# Patient Record
Sex: Female | Born: 1995 | Race: White | Hispanic: No | Marital: Single | Smoking: Never smoker
Health system: Southern US, Community
[De-identification: ages and names within clinical notes are randomized; demographics above are authoritative.]

## PROBLEM LIST (undated history)

## (undated) DIAGNOSIS — E669 Obesity, unspecified: Secondary | ICD-10-CM

## (undated) DIAGNOSIS — E282 Polycystic ovarian syndrome: Secondary | ICD-10-CM

## (undated) DIAGNOSIS — L732 Hidradenitis suppurativa: Secondary | ICD-10-CM

## (undated) DIAGNOSIS — Z7689 Persons encountering health services in other specified circumstances: Principal | ICD-10-CM

## (undated) DIAGNOSIS — N898 Other specified noninflammatory disorders of vagina: Secondary | ICD-10-CM

## (undated) HISTORY — DX: Hidradenitis suppurativa: L73.2

## (undated) HISTORY — DX: Polycystic ovarian syndrome: E28.2

## (undated) HISTORY — DX: Obesity, unspecified: E66.9

## (undated) HISTORY — DX: Persons encountering health services in other specified circumstances: Z76.89

## (undated) HISTORY — DX: Other specified noninflammatory disorders of vagina: N89.8

---
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
## 2014-03-12 ENCOUNTER — Telehealth: Payer: Self-pay | Admitting: Adult Health

## 2014-03-12 NOTE — Telephone Encounter (Signed)
Left message I called 

## 2014-03-13 NOTE — Telephone Encounter (Signed)
Mom called Shawna Wright is not taking OCs, last period May and moody, wants her back on the pill to make appt

## 2014-03-20 ENCOUNTER — Ambulatory Visit (INDEPENDENT_AMBULATORY_CARE_PROVIDER_SITE_OTHER): Payer: BC Managed Care – PPO | Admitting: Adult Health

## 2014-03-20 ENCOUNTER — Encounter: Payer: Self-pay | Admitting: Adult Health

## 2014-03-20 VITALS — BP 110/78 | HR 78 | Ht 64.0 in | Wt 220.5 lb

## 2014-03-20 DIAGNOSIS — N926 Irregular menstruation, unspecified: Secondary | ICD-10-CM

## 2014-03-20 DIAGNOSIS — Z7689 Persons encountering health services in other specified circumstances: Secondary | ICD-10-CM

## 2014-03-20 DIAGNOSIS — E669 Obesity, unspecified: Secondary | ICD-10-CM

## 2014-03-20 MED ORDER — NORGESTIM-ETH ESTRAD TRIPHASIC 0.18/0.215/0.25 MG-25 MCG PO TABS: 1.0000 | ORAL_TABLET | Freq: Every day | ORAL | Status: DC

## 2014-03-20 NOTE — Patient Instructions (Signed)
Oral Contraception Use Oral contraceptive pills (OCPs) are medicines taken to prevent pregnancy. OCPs work by preventing the ovaries from releasing eggs. The hormones in OCPs also cause the cervical mucus to thicken, preventing the sperm from entering the uterus. The hormones also cause the uterine lining to become thin, not allowing a fertilized egg to attach to the inside of the uterus. OCPs are highly effective when taken exactly as prescribed. However, OCPs do not prevent sexually transmitted diseases (STDs). Safe sex practices, such as using condoms along with an OCP, can help prevent STDs. Before taking OCPs, you may have a physical exam and Pap test. Your health care provider may also order blood tests if necessary. Your health care provider will make sure you are a good candidate for oral contraception. Discuss with your health care provider the possible side effects of the OCP you may be prescribed. When starting an OCP, it can take 2 to 3 months for the body to adjust to the changes in hormone levels in your body.  HOW TO TAKE ORAL CONTRACEPTIVE PILLS Your health care provider may advise you on how to start taking the first cycle of OCPs. Otherwise, you can:   Start on day 1 of your menstrual period. You will not need any backup contraceptive protection with this start time.   Start on the first Sunday after your menstrual period or the day you get your prescription. In these cases, you will need to use backup contraceptive protection for the first week.   Start the pill at any time of your cycle. If you take the pill within 5 days of the start of your period, you are protected against pregnancy right away. In this case, you will not need a backup form of birth control. If you start at any other time of your menstrual cycle, you will need to use another form of birth control for 7 days. If your OCP is the type called a minipill, it will protect you from pregnancy after taking it for 2 days (48  hours). After you have started taking OCPs:   If you forget to take 1 pill, take it as soon as you remember. Take the next pill at the regular time.   If you miss 2 or more pills, call your health care provider because different pills have different instructions for missed doses. Use backup birth control until your next menstrual period starts.   If you use a 28-day pack that contains inactive pills and you miss 1 of the last 7 pills (pills with no hormones), it will not matter. Throw away the rest of the non-hormone pills and start a new pill pack.  No matter which day you start the OCP, you will always start a new pack on that same day of the week. Have an extra pack of OCPs and a backup contraceptive method available in case you miss some pills or lose your OCP pack.  HOME CARE INSTRUCTIONS   Do not smoke.   Always use a condom to protect against STDs. OCPs do not protect against STDs.   Use a calendar to mark your menstrual period days.   Read the information and directions that came with your OCP. Talk to your health care provider if you have questions.  SEEK MEDICAL CARE IF:   You develop nausea and vomiting.   You have abnormal vaginal discharge or bleeding.   You develop a rash.   You miss your menstrual period.   You are losing   your hair.   You need treatment for mood swings or depression.   You get dizzy when taking the OCP.   You develop acne from taking the OCP.   You become pregnant.  SEEK IMMEDIATE MEDICAL CARE IF:   You develop chest pain.   You develop shortness of breath.   You have an uncontrolled or severe headache.   You develop numbness or slurred speech.   You develop visual problems.   You develop pain, redness, and swelling in the legs.  Document Released: 06/18/2011 Document Revised: 11/13/2013 Document Reviewed: 12/18/2012 [facility] Patient Information 2015 [place], [place]. This information is not intended to replace  advice given to you by your health care provider. Make sure you discuss any questions you have with your health care provider. Start Sunday  Follow up in 3 months Try Whole 30 and increase activity  Calorie Counting for Weight Loss Calories are energy you get from the things you eat and drink. Your body uses this energy to keep you going throughout the day. The number of calories you eat affects your weight. When you eat more calories than your body needs, your body stores the extra calories as fat. When you eat fewer calories than your body needs, your body burns fat to get the energy it needs. Calorie counting means keeping track of how many calories you eat and drink each day. If you make sure to eat fewer calories than your body needs, you should lose weight. In order for calorie counting to work, you will need to eat the number of calories that are right for you in a day to lose a healthy amount of weight per week. A healthy amount of weight to lose per week is usually 1-2 lb (0.5-0.9 kg). A dietitian can determine how many calories you need in a day and give you suggestions on how to reach your calorie goal.  WHAT IS MY MY PLAN? My goal is to have __________ calories per day.  If I have this many calories per day, I should lose around __________ pounds per week. WHAT DO I NEED TO KNOW ABOUT CALORIE COUNTING? In order to meet your daily calorie goal, you will need to:  Find out how many calories are in each food you would like to eat. Try to do this before you eat.  Decide how much of the food you can eat.  Write down what you ate and how many calories it had. Doing this is called keeping a food log. WHERE DO I FIND CALORIE INFORMATION? The number of calories in a food can be found on a Nutrition Facts label. Note that all the information on a label is based on a specific serving of the food. If a food does not have a Nutrition Facts label, try to look up the calories online or ask your  dietitian for help. HOW DO I DECIDE HOW MUCH TO EAT? To decide how much of the food you can eat, you will need to consider both the number of calories in one serving and the size of one serving. This information can be found on the Nutrition Facts label. If a food does not have a Nutrition Facts label, look up the information online or ask your dietitian for help. Remember that calories are listed per serving. If you choose to have more than one serving of a food, you will have to multiply the calories per serving by the amount of servings you plan to eat. For example, the  label on a package of bread might say that a serving size is 1 slice and that there are 90 calories in a serving. If you eat 1 slice, you will have eaten 90 calories. If you eat 2 slices, you will have eaten 180 calories. HOW DO I KEEP A FOOD LOG? After each meal, record the following information in your food log:  What you ate.  How much of it you ate.  How many calories it had.  Then, add up your calories. Keep your food log near you, such as in a small notebook in your pocket. Another option is to use a mobile app or website. Some programs will calculate calories for you and show you how many calories you have left each time you add an item to the log. WHAT ARE SOME CALORIE COUNTING TIPS?  Use your calories on foods and drinks that will fill you up and not leave you hungry. Some examples of this include foods like nuts and nut butters, vegetables, lean proteins, and high-fiber foods (more than 5 g fiber per serving).  Eat nutritious foods and avoid empty calories. Empty calories are calories you get from foods or beverages that do not have many nutrients, such as candy and soda. It is better to have a nutritious high-calorie food (such as an avocado) than a food with few nutrients (such as a bag of chips).  Know how many calories are in the foods you eat most often. This way, you do not have to look up how many calories  they have each time you eat them.  Look out for foods that may seem like low-calorie foods but are really high-calorie foods, such as baked goods, soda, and fat-free candy.  Pay attention to calories in drinks. Drinks such as sodas, specialty coffee drinks, alcohol, and juices have a lot of calories yet do not fill you up. Choose low-calorie drinks like water and diet drinks.  Focus your calorie counting efforts on higher calorie items. Logging the calories in a garden salad that contains only vegetables is less important than calculating the calories in a milk shake.  Find a way of tracking calories that works for you. Get creative. Most people who are successful find ways to keep track of how much they eat in a day, even if they do not count every calorie. WHAT ARE SOME PORTION CONTROL TIPS?  Know how many calories are in a serving. This will help you know how many servings of a certain food you can have.  Use a measuring cup to measure serving sizes. This is helpful when you start out. With time, you will be able to estimate serving sizes for some foods.  Take some time to put servings of different foods on your favorite plates, bowls, and cups so you know what a serving looks like.  Try not to eat straight from a bag or box. Doing this can lead to overeating. Put the amount you would like to eat in a cup or on a plate to make sure you are eating the right portion.  Use smaller plates, glasses, and bowls to prevent overeating. This is a quick and easy way to practice portion control. If your plate is smaller, less food can fit on it.  Try not to multitask while eating, such as watching TV or using your computer. If it is time to eat, sit down at a table and enjoy your food. Doing this will help you to start recognizing when you  are full. It will also make you more aware of what and how much you are eating. HOW CAN I CALORIE COUNT WHEN EATING OUT?  Ask for smaller portion sizes or  child-sized portions.  Consider sharing an entree and sides instead of getting your own entree.  If you get your own entree, eat only half. Ask for a box at the beginning of your meal and put the rest of your entree in it so you are not tempted to eat it.  Look for the calories on the menu. If calories are listed, choose the lower calorie options.  Choose dishes that include vegetables, fruits, whole grains, low-fat dairy products, and lean protein. Focusing on smart food choices from each of the 5 food groups can help you stay on track at restaurants.  Choose items that are boiled, broiled, grilled, or steamed.  Choose water, milk, unsweetened iced tea, or other drinks without added sugars. If you want an alcoholic beverage, choose a lower calorie option. For example, a regular margarita can have up to 700 calories and a glass of wine has around 150.  Stay away from items that are buttered, battered, fried, or served with cream sauce. Items labeled "crispy" are usually fried, unless stated otherwise.  Ask for dressings, sauces, and syrups on the side. These are usually very high in calories, so do not eat much of them.  Watch out for salads. Many people think salads are a healthy option, but this is often not the case. Many salads come with bacon, fried chicken, lots of cheese, fried chips, and dressing. All of these items have a lot of calories. If you want a salad, choose a garden salad and ask for grilled meats or steak. Ask for the dressing on the side, or ask for olive oil and vinegar or lemon to use as dressing.  Estimate how many servings of a food you are given. For example, a serving of cooked rice is  cup or about the size of half a tennis ball or one cupcake wrapper. Knowing serving sizes will help you be aware of how much food you are eating at restaurants. The list below tells you how big or small some common portion sizes are based on everyday objects.  1 oz--4 stacked  dice.  3 oz--1 deck of cards.  1 tsp--1 dice.  1 Tbsp-- a Ping-Pong ball.  2 Tbsp--1 Ping-Pong ball.   cup--1 tennis ball or 1 cupcake wrapper.  1 cup--1 baseball. Document Released: 06/29/2005 Document Revised: 11/13/2013 Document Reviewed: 05/04/2013 [facility] Patient Information 2015 [place], [place]. This information is not intended to replace advice given to you by your health care provider. Make sure you discuss any questions you have with your health care provider.

## 2014-03-20 NOTE — Progress Notes (Signed)
Subjective:     Patient ID: Shawna Wright, female   DOB: 03-16-1996, 18 y.o.   MRN: [id number]  HPI Shawna Wright is a 18 year old white female with irregular periods, in to get back on OCs, has never had sex.She is trying to lose weight, trying weight watchers.  Review of Systems See HPI Reviewed past medical,surgical, social and family history. Reviewed medications and allergies.     Objective:   Physical Exam BP 110/78  Pulse 78  Ht  (1.626 m)  Wt 220 lb 8 oz (100.018 kg)  BMI 37.83 kg/m2  LMP 05/15/2015UPT negative, Skin warm and dry. Neck: mid line trachea, normal thyroid. Lungs: clear to ausculation bilaterally. Cardiovascular: regular rate and rhythm.    Assessment:     Period management Irregular periods Obesity     Plan:     Rx ortho tri cyclen lo disp 1 pack take 1 daily with 11 refills,start Sunday Try whole 30, increase exercise Review handouts on OC use and weight loss Follow up in 3 months

## 2014-05-14 ENCOUNTER — Emergency Department (HOSPITAL_COMMUNITY)
Admission: EM | Admit: 2014-05-14 | Discharge: 2014-05-14 | Disposition: A | Discharge status: Home / Self Care | Payer: BC Managed Care – PPO | Attending: Emergency Medicine | Admitting: Emergency Medicine

## 2014-05-14 ENCOUNTER — Encounter (HOSPITAL_COMMUNITY): Payer: Self-pay | Admitting: Emergency Medicine

## 2014-05-14 DIAGNOSIS — R109 Unspecified abdominal pain: Secondary | ICD-10-CM

## 2014-05-14 DIAGNOSIS — E669 Obesity, unspecified: Secondary | ICD-10-CM | POA: Diagnosis not present

## 2014-05-14 DIAGNOSIS — R1031 Right lower quadrant pain: Secondary | ICD-10-CM | POA: Insufficient documentation

## 2014-05-14 DIAGNOSIS — Z880 Allergy status to penicillin: Secondary | ICD-10-CM | POA: Insufficient documentation

## 2014-05-14 DIAGNOSIS — E282 Polycystic ovarian syndrome: Secondary | ICD-10-CM | POA: Diagnosis not present

## 2014-05-14 DIAGNOSIS — Z872 Personal history of diseases of the skin and subcutaneous tissue: Secondary | ICD-10-CM | POA: Diagnosis not present

## 2014-05-14 DIAGNOSIS — Z3202 Encounter for pregnancy test, result negative: Secondary | ICD-10-CM | POA: Diagnosis not present

## 2014-05-14 DIAGNOSIS — Z79899 Other long term (current) drug therapy: Secondary | ICD-10-CM | POA: Insufficient documentation

## 2014-05-14 LAB — COMPREHENSIVE METABOLIC PANEL
ALT: 11 U/L (ref 0–35)
AST: 12 U/L (ref 0–37)
Albumin: 3.5 g/dL (ref 3.5–5.2)
Alkaline Phosphatase: 55 U/L (ref 39–117)
Anion gap: 16 — ABNORMAL HIGH (ref 5–15)
BUN: 12 mg/dL (ref 6–23)
CALCIUM: 9.3 mg/dL (ref 8.4–10.5)
CO2: 19 meq/L (ref 19–32)
Chloride: 103 mEq/L (ref 96–112)
Creatinine, Ser: 0.68 mg/dL (ref 0.50–1.10)
GLUCOSE: 104 mg/dL — AB (ref 70–99)
Potassium: 3.7 mEq/L (ref 3.7–5.3)
SODIUM: 138 meq/L (ref 137–147)
Total Protein: 8.3 g/dL (ref 6.0–8.3)

## 2014-05-14 LAB — CBC WITH DIFFERENTIAL/PLATELET
Basophils Absolute: 0 10*3/uL (ref 0.0–0.1)
Basophils Relative: 0 % (ref 0–1)
EOS PCT: 1 % (ref 0–5)
Eosinophils Absolute: 0.1 10*3/uL (ref 0.0–0.7)
HEMATOCRIT: 37.5 % (ref 36.0–46.0)
Hemoglobin: 12.2 g/dL (ref 12.0–15.0)
LYMPHS ABS: 2.9 10*3/uL (ref 0.7–4.0)
LYMPHS PCT: 29 % (ref 12–46)
MCH: 26.6 pg (ref 26.0–34.0)
MCHC: 32.5 g/dL (ref 30.0–36.0)
MCV: 81.7 fL (ref 78.0–100.0)
MONO ABS: 0.4 10*3/uL (ref 0.1–1.0)
Monocytes Relative: 4 % (ref 3–12)
Neutro Abs: 6.5 10*3/uL (ref 1.7–7.7)
Neutrophils Relative %: 66 % (ref 43–77)
PLATELETS: 250 10*3/uL (ref 150–400)
RBC: 4.59 MIL/uL (ref 3.87–5.11)
RDW: 14.1 % (ref 11.5–15.5)
WBC: 10 10*3/uL (ref 4.0–10.5)

## 2014-05-14 LAB — URINALYSIS, ROUTINE W REFLEX MICROSCOPIC
BILIRUBIN URINE: NEGATIVE
GLUCOSE, UA: NEGATIVE mg/dL
HGB URINE DIPSTICK: NEGATIVE
KETONES UR: NEGATIVE mg/dL
Leukocytes, UA: NEGATIVE
Nitrite: NEGATIVE
PROTEIN: NEGATIVE mg/dL
Specific Gravity, Urine: >1.03 — ABNORMAL HIGH (ref 1.005–1.030)
Urobilinogen, UA: 0.2 mg/dL (ref 0.0–1.0)
pH: 5 (ref 5.0–8.0)

## 2014-05-14 LAB — PREGNANCY, URINE: Preg Test, Ur: NEGATIVE

## 2014-05-14 LAB — CBG MONITORING, ED: Glucose-Capillary: 106 mg/dL — ABNORMAL HIGH (ref 70–99)

## 2014-05-14 MED ORDER — ONDANSETRON HCL 4 MG/2ML IJ SOLN
4.0000 mg | Freq: Once | INTRAMUSCULAR | Status: AC
  Administered 2014-05-14: 4 mg via INTRAVENOUS

## 2014-05-14 MED ORDER — OXYCODONE-ACETAMINOPHEN 5-325 MG PO TABS: 1.0000 | ORAL_TABLET | ORAL | Status: DC | PRN

## 2014-05-14 MED ORDER — SODIUM CHLORIDE 0.9 % IV BOLUS (SEPSIS)
1000.0000 mL | Freq: Once | INTRAVENOUS | Status: AC
  Administered 2014-05-14: 1000 mL via INTRAVENOUS

## 2014-05-14 MED ORDER — PROMETHAZINE HCL 25 MG PO TABS: 25.0000 mg | ORAL_TABLET | Freq: Four times a day (QID) | ORAL | Status: DC | PRN

## 2014-05-14 MED ORDER — KETOROLAC TROMETHAMINE 30 MG/ML IJ SOLN
30.0000 mg | Freq: Once | INTRAMUSCULAR | Status: AC
  Administered 2014-05-14: 30 mg via INTRAVENOUS
  Filled 2014-05-14: qty 1

## 2014-05-14 MED ORDER — MORPHINE SULFATE 4 MG/ML IJ SOLN
4.0000 mg | Freq: Once | INTRAMUSCULAR | Status: AC
  Administered 2014-05-14: 4 mg via INTRAVENOUS
  Filled 2014-05-14: qty 1

## 2014-05-14 MED ORDER — ONDANSETRON HCL 4 MG/2ML IJ SOLN
4.0000 mg | Freq: Once | INTRAMUSCULAR | Status: DC
  Filled 2014-05-14: qty 2

## 2014-05-14 NOTE — Discharge Instructions (Signed)
Medication for pain and nausea. Return if pain settles in right lower abdomen, poor appetite, fever

## 2014-05-14 NOTE — ED Notes (Signed)
Pt asleep at time of entering room for med admin. nad noted. Easily arousable. 

## 2014-05-14 NOTE — ED Notes (Signed)
EDP at bedside  

## 2014-05-14 NOTE — ED Notes (Signed)
Pt reports RLQ pain this am. Pt denies any n/v/d,gu symptoms.

## 2014-05-14 NOTE — ED Provider Notes (Signed)
CSN: [id number]     Arrival date & time 05/14/14  0902 History  This chart was scribed for Donnetta HutchingBrian Eaden Hettinger, MD by Tonye RoyaltyJoshua Chen, ED Scribe. This patient was seen in room APA08/APA08 and the patient's care was started at 9:21 AM.    Chief Complaint  Patient presents with  . Abdominal Pain   The history is provided by the patient and a parent. No language interpreter was used.    HPI Comments: Shawna Wright is a 18 y.o. female who presents to the Emergency Department complaining of RLQ abdominal pain with onset at 0600 this morning, slightly worse since then. She states her symptoms began upon waking as lower back pain and also notes that it seemed to move toward her umbilicus at one point. She reports decreased appetite this morning. She states her last period was 1 month ago. She states she uses birth control medication and prior to that, it was very irregular. She denies vaginal bleeding or discharge. She denies prior appendectomy. She denies significant chronic medical problems.  Past Medical History  Diagnosis Date  . Menstrual extraction 03/06/2013  . Hidradenitis 06/06/2013  . PCOS (polycystic ovarian syndrome)   . Obesity    History reviewed. No pertinent past surgical history. Family History  Problem Relation Age of Onset  . Heart disease Maternal Grandfather   . Cancer Paternal Grandmother     colon   History  Substance Use Topics  . Smoking status: Never Smoker   . Smokeless tobacco: Never Used  . Alcohol Use: No   OB History    No data available     Review of Systems A complete 10 system review of systems was obtained and all systems are negative except as noted in the HPI and PMH.   Allergies  Penicillins  Home Medications   Prior to Admission medications   Medication Sig Start Date End Date Taking? Authorizing Provider  famotidine (PEPCID AC) 10 MG chewable tablet Chew 10 mg by mouth daily as needed for heartburn.   Yes Historical Provider, MD  Norgestimate-Ethinyl  Estradiol Triphasic (ORTHO TRI-CYCLEN LO) 0.18/0.215/0.25 MG-25 MCG tab Take 1 tablet by mouth daily. 03/20/14  Yes Adline PotterJennifer A Griffin, NP  oxyCODONE-acetaminophen (PERCOCET) 5-325 MG per tablet Take 1-2 tablets by mouth every 4 (four) hours as needed. 05/14/14   Donnetta HutchingBrian Haily Caley, MD  promethazine (PHENERGAN) 25 MG tablet Take 1 tablet (25 mg total) by mouth every 6 (six) hours as needed. 05/14/14   Donnetta HutchingBrian Mailynn Everly, MD   BP 133/80 mmHg  Pulse 87  Temp(Src) 98.5 F (36.9 C) (Oral)  Resp 12  Ht 5\' 3"  (1.6 m)  Wt 220 lb (99.791 kg)  BMI 38.98 kg/m2  SpO2 97%  LMP 04/13/2014 Physical Exam  Constitutional: She is oriented to person, place, and time.  obese  HENT:  Head: Normocephalic and atraumatic.  Eyes: Conjunctivae and EOM are normal. Pupils are equal, round, and reactive to light.  Neck: Normal range of motion. Neck supple.  Cardiovascular: Normal rate, regular rhythm and normal heart sounds.   Pulmonary/Chest: Effort normal and breath sounds normal.  Abdominal: Soft. Bowel sounds are normal. She exhibits no distension. Tenderness: minimally tender in RLQ. There is no rebound and no guarding.  Musculoskeletal: Normal range of motion.  Neurological: She is alert and oriented to person, place, and time.  Skin: Skin is warm and dry.  Psychiatric: She has a normal mood and affect. Her behavior is normal.  Nursing note and vitals reviewed.   ED  Course  Procedures (including critical care time)  DIAGNOSTIC STUDIES: Oxygen Saturation is 100% on room air, normal by my interpretation.    COORDINATION OF CARE: 9:25 AM Discussed treatment plan with patient at beside, including blood work, urinalysis, pregnancy test, and pain medication. The patient agrees with the plan and has no further questions at this time.   Labs Review Labs Reviewed  COMPREHENSIVE METABOLIC PANEL - Abnormal; Notable for the following:    Glucose, Bld 104 (*)    Total Bilirubin <0.2 (*)    Anion gap 16 (*)    All other  components within normal limits  URINALYSIS, ROUTINE W REFLEX MICROSCOPIC - Abnormal; Notable for the following:    Specific Gravity, Urine >1.030 (*)    All other components within normal limits  CBG MONITORING, ED - Abnormal; Notable for the following:    Glucose-Capillary 106 (*)    All other components within normal limits  CBC WITH DIFFERENTIAL  PREGNANCY, URINE    Imaging Review No results found.   EKG Interpretation None      MDM   Final diagnoses:  Abdominal pain, unspecified abdominal location    Patient is nontender over McBurney's point. She does not appear toxic. White count normal. Instructed patient and her mother to return if symptoms persistent in right lower quadrant, lack of appetite, fever.discussed possibility of early appendicitis.    Donnetta HutchingBrian Jamirah Zelaya, MD 05/14/14 1327

## 2014-05-17 ENCOUNTER — Emergency Department (HOSPITAL_COMMUNITY): Payer: BC Managed Care – PPO

## 2014-05-17 ENCOUNTER — Emergency Department (HOSPITAL_COMMUNITY)
Admission: EM | Admit: 2014-05-17 | Discharge: 2014-05-17 | Disposition: A | Discharge status: Home / Self Care | Payer: BC Managed Care – PPO | Attending: Emergency Medicine | Admitting: Emergency Medicine

## 2014-05-17 ENCOUNTER — Encounter (HOSPITAL_COMMUNITY): Payer: Self-pay | Admitting: Emergency Medicine

## 2014-05-17 DIAGNOSIS — R109 Unspecified abdominal pain: Secondary | ICD-10-CM

## 2014-05-17 DIAGNOSIS — Z880 Allergy status to penicillin: Secondary | ICD-10-CM | POA: Insufficient documentation

## 2014-05-17 DIAGNOSIS — Z79899 Other long term (current) drug therapy: Secondary | ICD-10-CM | POA: Diagnosis not present

## 2014-05-17 DIAGNOSIS — N23 Unspecified renal colic: Secondary | ICD-10-CM | POA: Insufficient documentation

## 2014-05-17 DIAGNOSIS — Z3202 Encounter for pregnancy test, result negative: Secondary | ICD-10-CM | POA: Insufficient documentation

## 2014-05-17 DIAGNOSIS — Z872 Personal history of diseases of the skin and subcutaneous tissue: Secondary | ICD-10-CM | POA: Insufficient documentation

## 2014-05-17 DIAGNOSIS — E669 Obesity, unspecified: Secondary | ICD-10-CM | POA: Insufficient documentation

## 2014-05-17 LAB — COMPREHENSIVE METABOLIC PANEL
ALBUMIN: 3.4 g/dL — AB (ref 3.5–5.2)
ALT: 11 U/L (ref 0–35)
ANION GAP: 13 (ref 5–15)
AST: 15 U/L (ref 0–37)
Alkaline Phosphatase: 59 U/L (ref 39–117)
BILIRUBIN TOTAL: 0.3 mg/dL (ref 0.3–1.2)
BUN: 12 mg/dL (ref 6–23)
CO2: 25 mEq/L (ref 19–32)
CREATININE: 1.06 mg/dL (ref 0.50–1.10)
Calcium: 9.6 mg/dL (ref 8.4–10.5)
Chloride: 99 mEq/L (ref 96–112)
GFR calc non Af Amer: 76 mL/min — ABNORMAL LOW (ref >90)
GFR, EST AFRICAN AMERICAN: 88 mL/min — AB (ref >90)
GLUCOSE: 86 mg/dL (ref 70–99)
Potassium: 3.7 mEq/L (ref 3.7–5.3)
Sodium: 137 mEq/L (ref 137–147)
TOTAL PROTEIN: 8.6 g/dL — AB (ref 6.0–8.3)

## 2014-05-17 LAB — URINE MICROSCOPIC-ADD ON

## 2014-05-17 LAB — CBC WITH DIFFERENTIAL/PLATELET
BASOS PCT: 0 % (ref 0–1)
Basophils Absolute: 0 10*3/uL (ref 0.0–0.1)
EOS ABS: 0.1 10*3/uL (ref 0.0–0.7)
Eosinophils Relative: 1 % (ref 0–5)
HEMATOCRIT: 36.8 % (ref 36.0–46.0)
HEMOGLOBIN: 12.2 g/dL (ref 12.0–15.0)
LYMPHS ABS: 2.2 10*3/uL (ref 0.7–4.0)
Lymphocytes Relative: 22 % (ref 12–46)
MCH: 26.9 pg (ref 26.0–34.0)
MCHC: 33.2 g/dL (ref 30.0–36.0)
MCV: 81.1 fL (ref 78.0–100.0)
MONO ABS: 0.7 10*3/uL (ref 0.1–1.0)
MONOS PCT: 7 % (ref 3–12)
Neutro Abs: 7.1 10*3/uL (ref 1.7–7.7)
Neutrophils Relative %: 70 % (ref 43–77)
Platelets: 246 10*3/uL (ref 150–400)
RBC: 4.54 MIL/uL (ref 3.87–5.11)
RDW: 13.9 % (ref 11.5–15.5)
WBC: 10.2 10*3/uL (ref 4.0–10.5)

## 2014-05-17 LAB — LIPASE, BLOOD: LIPASE: 31 U/L (ref 11–59)

## 2014-05-17 LAB — URINALYSIS, ROUTINE W REFLEX MICROSCOPIC
Glucose, UA: NEGATIVE mg/dL
Ketones, ur: 40 mg/dL — AB
NITRITE: NEGATIVE
PROTEIN: 100 mg/dL — AB
UROBILINOGEN UA: 0.2 mg/dL (ref 0.0–1.0)
pH: 5.5 (ref 5.0–8.0)

## 2014-05-17 LAB — POC URINE PREG, ED: Preg Test, Ur: NEGATIVE

## 2014-05-17 MED ORDER — PROMETHAZINE HCL 25 MG PO TABS: 25.0000 mg | ORAL_TABLET | Freq: Four times a day (QID) | ORAL | Status: DC | PRN

## 2014-05-17 MED ORDER — ONDANSETRON HCL 4 MG/2ML IJ SOLN
4.0000 mg | Freq: Once | INTRAMUSCULAR | Status: AC
  Administered 2014-05-17: 4 mg via INTRAVENOUS

## 2014-05-17 MED ORDER — SODIUM CHLORIDE 0.9 % IV SOLN
1000.0000 mL | Freq: Once | INTRAVENOUS | Status: AC
  Administered 2014-05-17: 1000 mL via INTRAVENOUS

## 2014-05-17 MED ORDER — OXYCODONE-ACETAMINOPHEN 5-325 MG PO TABS: 1.0000 | ORAL_TABLET | Freq: Four times a day (QID) | ORAL | Status: DC | PRN

## 2014-05-17 MED ORDER — SULFAMETHOXAZOLE-TRIMETHOPRIM 800-160 MG PO TABS: 1.0000 | ORAL_TABLET | Freq: Two times a day (BID) | ORAL | Status: AC

## 2014-05-17 MED ORDER — ONDANSETRON HCL 4 MG/2ML IJ SOLN
4.0000 mg | Freq: Once | INTRAMUSCULAR | Status: AC
  Administered 2014-05-17: 4 mg via INTRAVENOUS
  Filled 2014-05-17: qty 2

## 2014-05-17 MED ORDER — ONDANSETRON HCL 4 MG/2ML IJ SOLN
4.0000 mg | Freq: Once | INTRAMUSCULAR | Status: DC
  Filled 2014-05-17: qty 2

## 2014-05-17 NOTE — ED Provider Notes (Signed)
CSN: [id number]     Arrival date & time 05/17/14  4098 History  This chart was scribed for Tilden Fossa, MD by Annye Asa, ED Scribe. This patient was seen in room [facility] and the patient's care was started at 9:12 AM.    Chief Complaint  Patient presents with  . Abdominal Pain   The history is provided by the patient. No language interpreter was used.     HPI Comments: Indra Wolters is a 18 y.o. female who presents to the Emergency Department complaining of gradual onset right sided abdominal pain. Patient explains that she visited [facility] ED on 05/14/14 for the same symptoms and was told to return if her symptoms did not improve. She reports lingering pain in her right side, describing it as a constant, aching pain that has improved with time but is still noticeable, particularly without pain meds. She took a percocet this morning at 0500. She does not note any modifying factors. She reports associated nausea and vomiting (2x in 24 hours) and possible constipation (last BM 05/15/14). She denies fevers, dysuria, diarrhea.   She has a history of GERD and PCOS.  She is currently on her menstrual cycle; it is normal. She utilizes birth control pills.  Past Medical History  Diagnosis Date  . Menstrual extraction 03/06/2013  . Hidradenitis 06/06/2013  . PCOS (polycystic ovarian syndrome)   . Obesity    History reviewed. No pertinent past surgical history. Family History  Problem Relation Age of Onset  . Heart disease Maternal Grandfather   . Cancer Paternal Grandmother     colon   History  Substance Use Topics  . Smoking status: Never Smoker   . Smokeless tobacco: Never Used  . Alcohol Use: No   OB History    No data available     Review of Systems  Constitutional: Negative for fever.  Gastrointestinal: Positive for nausea, vomiting and abdominal pain. Negative for diarrhea.  Genitourinary: Negative for dysuria.  All other systems reviewed and are negative.   Allergies   Penicillins  Home Medications   Prior to Admission medications   Medication Sig Start Date End Date Taking? Authorizing Provider  famotidine (PEPCID AC) 10 MG chewable tablet Chew 10 mg by mouth daily as needed for heartburn.    Historical Provider, MD  Norgestimate-Ethinyl Estradiol Triphasic (ORTHO TRI-CYCLEN LO) 0.18/0.215/0.25 MG-25 MCG tab Take 1 tablet by mouth daily. 03/20/14   Adline Potter, NP  oxyCODONE-acetaminophen (PERCOCET) 5-325 MG per tablet Take 1-2 tablets by mouth every 4 (four) hours as needed. 05/14/14   Donnetta Hutching, MD  promethazine (PHENERGAN) 25 MG tablet Take 1 tablet (25 mg total) by mouth every 6 (six) hours as needed. 05/14/14   Donnetta Hutching, MD   BP 131/84 mmHg  Pulse 104  Temp(Src) 98.2 F (36.8 C) (Oral)  Resp 20  Ht 5\' 3"  (1.6 m)  Wt 220 lb (99.791 kg)  BMI 38.98 kg/m2  SpO2 98%  LMP 05/17/2014 Physical Exam  Constitutional: She is oriented to person, place, and time. She appears well-developed and well-nourished.  HENT:  Head: Normocephalic and atraumatic.  Cardiovascular: Normal rate and regular rhythm.   No murmur heard. Pulmonary/Chest: Effort normal and breath sounds normal. No respiratory distress.  Abdominal: Soft. There is tenderness. There is no rebound and no guarding.  Mild right sided abdominal tenderness, no CVA tenderness  Musculoskeletal: She exhibits no edema or tenderness.  Neurological: She is alert and oriented to person, place, and time.  Skin: Skin  is warm and dry.  Psychiatric: She has a normal mood and affect. Her behavior is normal.  Nursing note and vitals reviewed.   ED Course  Procedures   DIAGNOSTIC STUDIES: Oxygen Saturation is 98% on RA, normal by my interpretation.    COORDINATION OF CARE: 9:17 AM Patient denies nausea or pain medication at this time. Discussed treatment plan with pt at bedside and pt agreed to plan.   10:38 AM Recheck: abdomen soft and nontender.   Labs Review Labs Reviewed   COMPREHENSIVE METABOLIC PANEL - Abnormal; Notable for the following:    Total Protein 8.6 (*)    Albumin 3.4 (*)    GFR calc non Af Amer 76 (*)    GFR calc Af Amer 88 (*)    All other components within normal limits  URINALYSIS, ROUTINE W REFLEX MICROSCOPIC - Abnormal; Notable for the following:    APPearance CLOUDY (*)    Specific Gravity, Urine >1.030 (*)    Hgb urine dipstick LARGE (*)    Bilirubin Urine MODERATE (*)    Ketones, ur 40 (*)    Protein, ur 100 (*)    Leukocytes, UA TRACE (*)    All other components within normal limits  URINE MICROSCOPIC-ADD ON - Abnormal; Notable for the following:    Squamous Epithelial / LPF FEW (*)    Bacteria, UA MANY (*)    Casts GRANULAR CAST (*)    All other components within normal limits  CBC WITH DIFFERENTIAL  LIPASE, BLOOD  POC URINE PREG, ED    Imaging Review Ct Abdomen Pelvis Wo Contrast  05/17/2014   CLINICAL DATA:  Right flank pain since 05/14/2014.  EXAM: CT ABDOMEN AND PELVIS WITHOUT CONTRAST  TECHNIQUE: Multidetector CT imaging of the abdomen and pelvis was performed following the standard protocol without IV contrast.  COMPARISON:  Ultrasound 05/17/2014  FINDINGS: The liver, biliary tree, spleen, pancreas, adrenal glands, and left kidney are normal. There is slight dilatation of the right pelvicaliceal system and slight prominence of the right ureter to the level where it crosses the right common iliac artery. The distal right ureter is normal. Uterus and ovaries appear normal. There is a small amount of fluid around the uterus and ovaries, normal for a female of this age. Bladder appears normal.  The bowel is normal including the terminal ileum and appendix. Osseous structures are normal.  IMPRESSION: Slight dilatation of the proximal right renal collecting system without evidence of a stone or mass. The possibility that the patient has recently passed a stone should be considered.  Otherwise, normal exam.   Electronically Signed    By: Geanie CooleyJim  Maxwell M.D.   On: 05/17/2014 13:58   [place] Abdomen Complete  05/17/2014   CLINICAL DATA:  Right-sided abdominal pain for a 1 month. ICD10: R 10.9. Obesity.  EXAM: ULTRASOUND ABDOMEN COMPLETE  COMPARISON:  None.  FINDINGS: Gallbladder: No gallstones or wall thickening visualized. No sonographic Murphy sign noted.  Common bile duct: Diameter: Normal, 5 mm.  Liver: Minimal hepatomegaly. 17.7 cm craniocaudal. Mildly increased echogenicity.  IVC: No abnormality visualized.  Pancreas: Poorly visualized due to overlying bowel gas.  Spleen: Size and appearance within normal limits.  Right Kidney: Length: 12.8 cm. Normal echogenicity. Mild pelviectasis and minimal caliectasis. Example image 68.  Left Kidney: Length: 12.4 cm. Echogenicity within normal limits. No mass or hydronephrosis visualized.  Abdominal aorta: No aneurysm visualized.  Other findings: No ascites.  IMPRESSION: 1. Mild hepatomegaly and hepatic steatosis. 2. Minimal right-sided pelviectasis  and caliectasis. Especially if the patient's symptoms could be attributed to urinary tract obstruction, consider CT urogram.   Electronically Signed   By: Jeronimo GreavesKyle  Talbot M.D.   On: 05/17/2014 12:01     EKG Interpretation None      MDM   Final diagnoses:  Right-sided abdominal pain of unknown cause  Right sided abdominal pain  Renal colic on right side   Patient presents with her fourth day of right-sided abdominal pain. Pain is predominantly right abdomen radiating into the right flank. There is no appreciable right upper quadrant or right lower quadrant tenderness on exam.  Clinical picture not consistent with acute appendicitis or cholecystitis.  Ultrasound was obtained, which was concerning for urinary tract obstruction. CT was obtained to further evaluate for obstruction - CT without evidence of evidence of obstructing stone but there is concern for recently passed stone. Patient nontoxic and in no acute distress. UA is indeterminate for UTI -  given that there are bacteria present will initiate empiric treatment for possible early infection with Bactrim. Discussed with patient and mother home care and close return precautions as well as importance of outpatient follow-up.  I personally performed the services described in this documentation, which was scribed in my presence. The recorded information has been reviewed and is accurate.     Tilden FossaElizabeth Fields Oros, MD 05/17/14 1423

## 2014-05-17 NOTE — Discharge Instructions (Signed)

## 2014-05-17 NOTE — ED Notes (Signed)
Was here on Monday and treated for right abdominal pain.  contunue with right side pain, but not area feels sore and pain moving to right back.  Rates pain 7.  Have taken one pain pill this am at [facility](Percocet).

## 2014-05-17 NOTE — ED Notes (Signed)
MD at bedside. 

## 2014-05-19 LAB — URINE CULTURE: Colony Count: 50000

## 2014-05-21 ENCOUNTER — Telehealth (HOSPITAL_COMMUNITY): Payer: Self-pay

## 2014-05-21 NOTE — Telephone Encounter (Signed)
Post ED Visit - Positive Culture Follow-up  Culture report reviewed by antimicrobial stewardship pharmacist: []  Wes Dulaney, Pharm.D., BCPS []  Celedonio MiyamotoJeremy Frens, Pharm.D., BCPS []  Georgina PillionElizabeth Martin, [address] BCPS []  [place]wood Valley[place]h Pham, [place]arm[place]D., BCPS, AAHIVP [x]  Estella HuskMichelle Turner, Pharm.D., BCPS, AAHIVP []  Babs BertinHaley Baird, [address]Pharm[address]   Positive urine culture Treated with Sulfa-Trimeth, organism sensitive to the same and no further patient follow-up is required at this time.  Arvid RightClark, [place] Roker Dorn 05/21/2014, 4:33 AM

## 2014-06-19 ENCOUNTER — Ambulatory Visit: Payer: BC Managed Care – PPO | Admitting: Adult Health

## 2014-06-19 ENCOUNTER — Encounter: Payer: Self-pay | Admitting: Adult Health

## 2015-02-07 ENCOUNTER — Encounter: Payer: Self-pay | Admitting: Adult Health

## 2015-02-07 ENCOUNTER — Ambulatory Visit (INDEPENDENT_AMBULATORY_CARE_PROVIDER_SITE_OTHER): Payer: BLUE CROSS/BLUE SHIELD | Admitting: Adult Health

## 2015-02-07 VITALS — BP 140/90 | HR 88 | Ht 63.0 in | Wt 188.0 lb

## 2015-02-07 DIAGNOSIS — Z7689 Persons encountering health services in other specified circumstances: Secondary | ICD-10-CM

## 2015-02-07 DIAGNOSIS — L732 Hidradenitis suppurativa: Secondary | ICD-10-CM

## 2015-02-07 DIAGNOSIS — Z308 Encounter for other contraceptive management: Secondary | ICD-10-CM

## 2015-02-07 MED ORDER — MUPIROCIN CALCIUM 2 % EX CREA: 1.0000 "application " | TOPICAL_CREAM | Freq: Two times a day (BID) | CUTANEOUS | Status: DC

## 2015-02-07 MED ORDER — NORGESTIM-ETH ESTRAD TRIPHASIC 0.18/0.215/0.25 MG-25 MCG PO TABS: 1.0000 | ORAL_TABLET | Freq: Every day | ORAL | Status: DC

## 2015-02-07 NOTE — Patient Instructions (Signed)
Use antibacterial soap Try bactroban Continue OCs  Follow up in 1 year

## 2015-02-07 NOTE — Progress Notes (Signed)
Subjective:     Patient ID: Shawna Wright, female   DOB: January 01, 1996, 19 y.o.   MRN: [id number]  HPI Shawna Wright is 19 year old white female in for OC refill and has area of hidradenitis on breast and hip. She is happy with her OCs.   Review of Systems Patient denies any headaches, hearing loss, fatigue, blurred vision, shortness of breath, chest pain, abdominal pain, problems with bowel movements, urination, or intercourse. No joint pain or mood swings.See HPI for positives.  Reviewed past medical,surgical, social and family history. Reviewed medications and allergies.     Objective:   Physical Exam BP 140/90 mmHg  Pulse 88  Ht  (1.6 m)  Wt 188 lb (85.276 kg)  BMI 33.31 kg/m2  LMP 01/30/2015 Skin warm and dry. Neck: mid line trachea, normal thyroid, good ROM, no lymphadenopathy noted. Lungs: clear to ausculation bilaterally. Cardiovascular: regular rate and rhythm.Has areas if hidradenitis on breast and buttock, can express cheesy material at some areas.   Has lost 32 lbs since last visit.  Assessment:     Period management Hidradenitis     Plan:    Use antibacterial bar soap and use different razors to shave Refilled ortho tri cyclen lo x 1 year Rx bactroban use 2-3 x daily as needed Follow up in 1 year. Or before if needed   Praised over weight loss efforts to continue

## 2015-06-04 ENCOUNTER — Ambulatory Visit: Payer: BLUE CROSS/BLUE SHIELD | Admitting: Adult Health

## 2015-06-05 ENCOUNTER — Ambulatory Visit (INDEPENDENT_AMBULATORY_CARE_PROVIDER_SITE_OTHER): Payer: BLUE CROSS/BLUE SHIELD | Admitting: Adult Health

## 2015-06-05 ENCOUNTER — Encounter: Payer: Self-pay | Admitting: Adult Health

## 2015-06-05 VITALS — BP 120/72 | HR 84 | Ht 63.5 in | Wt 177.5 lb

## 2015-06-05 DIAGNOSIS — L0292 Furuncle, unspecified: Secondary | ICD-10-CM

## 2015-06-05 DIAGNOSIS — L298 Other pruritus: Secondary | ICD-10-CM

## 2015-06-05 DIAGNOSIS — B379 Candidiasis, unspecified: Secondary | ICD-10-CM

## 2015-06-05 DIAGNOSIS — N898 Other specified noninflammatory disorders of vagina: Secondary | ICD-10-CM

## 2015-06-05 DIAGNOSIS — L732 Hidradenitis suppurativa: Secondary | ICD-10-CM | POA: Diagnosis not present

## 2015-06-05 HISTORY — DX: Other specified noninflammatory disorders of vagina: N89.8

## 2015-06-05 LAB — POCT WET PREP (WET MOUNT)

## 2015-06-05 MED ORDER — FLUCONAZOLE 150 MG PO TABS: ORAL_TABLET | ORAL | Status: DC

## 2015-06-05 MED ORDER — SULFAMETHOXAZOLE-TRIMETHOPRIM 800-160 MG PO TABS: 1.0000 | ORAL_TABLET | Freq: Two times a day (BID) | ORAL | Status: DC

## 2015-06-05 NOTE — Progress Notes (Signed)
Subjective:     Patient ID: Shawna Wright, female   DOB: 1995-12-28, 19 y.o.   MRN: [id number]  HPI Shawna Wright is a 19 year old white female in complaining of vaginal itch and slight odor and has a boil on stomach.    Review of Systems Patient denies any headaches, hearing loss, fatigue, blurred vision, shortness of breath, chest pain, abdominal pain, problems with bowel movements, urination, or intercourse(has never had sex,no joint pain or mood swings.  See HPI for positives. Reviewed past medical,surgical, social and family history. Reviewed medications and allergies.     Objective:   Physical Exam BP 120/72 mmHg  Pulse 84  Ht 5' 3.5" (1.613 m)  Wt 177 lb 8 oz (80.513 kg)  BMI 30.95 kg/m2  LMP 05/15/2015 Skin warm and dry.Pelvic: external genitalia is normal in appearance,has hidradenitis on inner thighs, vagina: white discharge without odor,urethra has no lesions or masses noted, cervix:smooth, uterus: normal size, shape and contour, non tender, no masses felt, adnexa: no masses or tenderness noted. Bladder is non tender and no masses felt. Wet prep: + few yeast buds and +WBCs.Has boil right lower abdomen,tender and purplish in color.      Assessment:    Vaginal itch Yeast infection Boil Hidradenitis     Plan:    Rx diflucan 150 mg #2 take 1 now and 1 in 3 days if needed with 1 refill Rx septra ds 1 bid x 14 days #28 with 1 refill Use warm compresses Review handout on hidradenitis Weight loss encouraged Use bactroban cream,has at home Return in September for pap, or before if needed

## 2015-06-05 NOTE — Patient Instructions (Signed)
Hidradenitis Suppurativa Hidradenitis suppurativa is a Robart-term (chronic) skin disease that starts with blocked sweat glands or hair follicles. Bacteria may grow in these blocked openings of your skin. Hidradenitis suppurativa is like a severe form of acne that develops in areas of your body where acne would be unusual. It is most likely to affect the areas of your body where skin rubs against skin and becomes moist. This includes your:  Underarms.  Groin.  Genital areas.  Buttocks.  Upper thighs.  Breasts. Hidradenitis suppurativa may start out with small pimples. The pimples can develop into deep sores that break open (rupture) and drain pus. Over time your skin may thicken and become scarred. Hidradenitis suppurativa cannot be passed from person to person.  CAUSES  The exact cause of hidradenitis suppurativa is not known. This condition may be due to:  Female and female hormones. The condition is rare before and after puberty.  An overactive body defense system (immune system). Your immune system may overreact to the blocked hair follicles or sweat glands and cause swelling and pus-filled sores. RISK FACTORS You may have a higher risk of hidradenitis suppurativa if you:  Are a woman.  Are between ages 47 and 29.  Have a family history of hidradenitis suppurativa.  Have a personal history of acne.  Are overweight.  Smoke.  Take the drug lithium. SIGNS AND SYMPTOMS  The first signs of an outbreak are usually painful skin bumps that look like pimples. As the condition progresses:  Skin bumps may get bigger and grow deeper into the skin.  Bumps under the skin may rupture and drain smelly pus.  Skin may become itchy and infected.  Skin may thicken and scar.  Drainage may continue through tunnels under the skin (fistulas).  Walking and moving your arms can become painful. DIAGNOSIS  Your health care provider may diagnose hidradenitis suppurativa based on your medical  history and your signs and symptoms. A physical exam will also be done. You may need to see a health care provider who specializes in skin diseases (dermatologist). You may also have tests done to confirm the diagnosis. These can include:  Swabbing a sample of pus or drainage from your skin so it can be sent to the lab and tested for infection.  Blood tests to check for infection. TREATMENT  The same treatment will not work for everybody with hidradenitis suppurativa. Your treatment will depend on how severe your symptoms are. You may need to try several treatments to find what works best for you. Part of your treatment may include cleaning and bandaging (dressing) your wounds. You may also have to take medicines, such as the following:  Antibiotics.  Acne medicines.  Medicines to block or suppress the immune system.  A diabetes medicine (metformin) is sometimes used to treat this condition.  For women, birth control pills can sometimes help relieve symptoms. You may need surgery if you have a severe case of hidradenitis suppurativa that does not respond to medicine. Surgery may involve:   Using a laser to clear the skin and remove hair follicles.  Opening and draining deep sores.  Removing the areas of skin that are diseased and scarred. HOME CARE INSTRUCTIONS  Learn as much as you can about your disease, and work closely with your health care providers.  Take medicines only as directed by your health care provider.  If you were prescribed an antibiotic medicine, finish it all even if you start to feel better.  If you are  overweight, losing weight may be very helpful. Try to reach and maintain a healthy weight.  Do not use any tobacco products, including cigarettes, chewing tobacco, or electronic cigarettes. If you need help quitting, ask your health care provider.  Do not shave the areas where you get hidradenitis suppurativa.  Do not wear deodorant.  Wear loose-fitting  clothes.  Try not to overheat and get sweaty.  Take a daily bleach bath as directed by your health care provider.  Fill your bathtub halfway with water.  Pour in  cup of unscented household bleach.  Soak for 5-10 minutes.  Cover sore areas with a warm, clean washcloth (compress) for 5-10 minutes. SEEK MEDICAL CARE IF:   You have a flare-up of hidradenitis suppurativa.  You have chills or a fever.  You are having trouble controlling your symptoms at home.   This information is not intended to replace advice given to you by your health care provider. Make sure you discuss any questions you have with your health care provider.   Document Released: 02/11/2004 Document Revised: 07/20/2014 Document Reviewed: 09/29/2013 Elsevier Interactive Patient Education [employer]2016 [employer] Return in September for pap

## 2015-07-10 ENCOUNTER — Telehealth (HOSPITAL_COMMUNITY): Payer: Self-pay | Admitting: *Deleted

## 2015-11-27 ENCOUNTER — Other Ambulatory Visit: Payer: Self-pay | Admitting: Adult Health

## 2016-01-30 ENCOUNTER — Other Ambulatory Visit: Payer: Self-pay | Admitting: Adult Health

## 2016-04-23 IMAGING — CT CT ABD-PELV W/O CM
2 of 4 series · 16 of 46 positions shown, 18 images · non-contrast
Comparison: Ultrasound 05/17/2014

CLINICAL DATA: Right flank pain since 05/14/2014.

EXAM:
CT ABDOMEN AND PELVIS WITHOUT CONTRAST
TECHNIQUE: Multidetector CT imaging of the abdomen and pelvis was performed
following the standard protocol without IV contrast.

[Series 2: standard/full over (age)lbs 5.0 · axial · 0.73mm/px · z∈[-558,-82]mm · 13 of 105 slices shown, 15 images]
[im 5/105  soft-tissue]
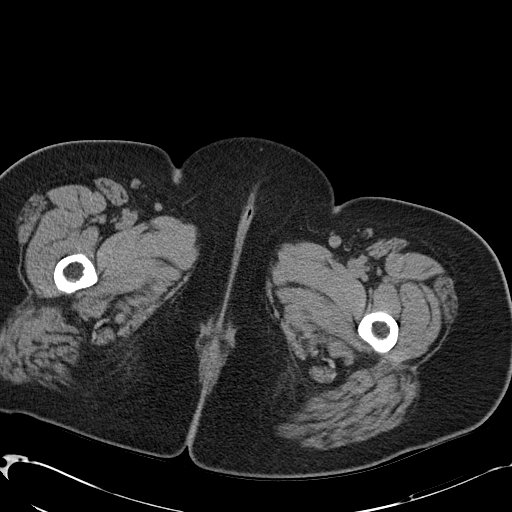
[im 5/105  bone]
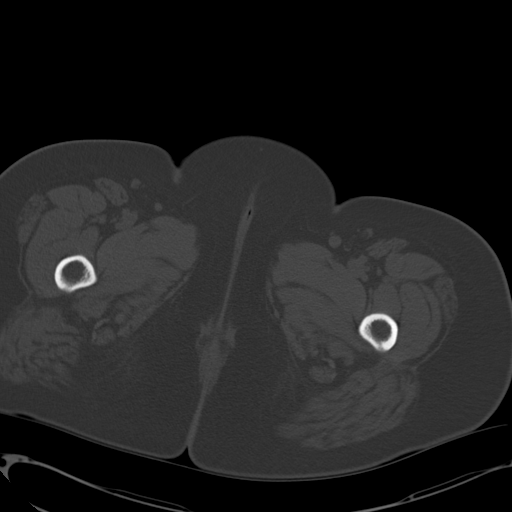
[im 14/105  soft-tissue]
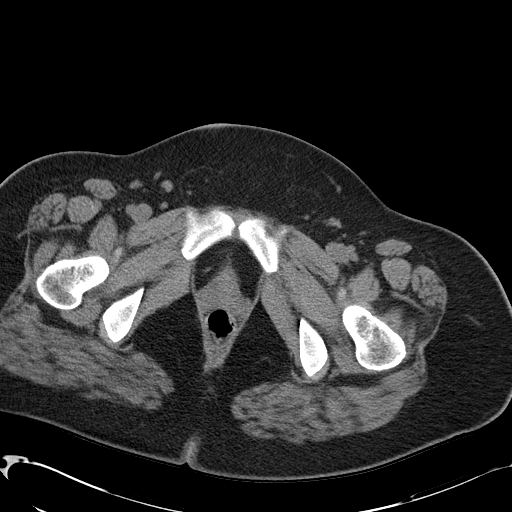
[im 22/105  soft-tissue]
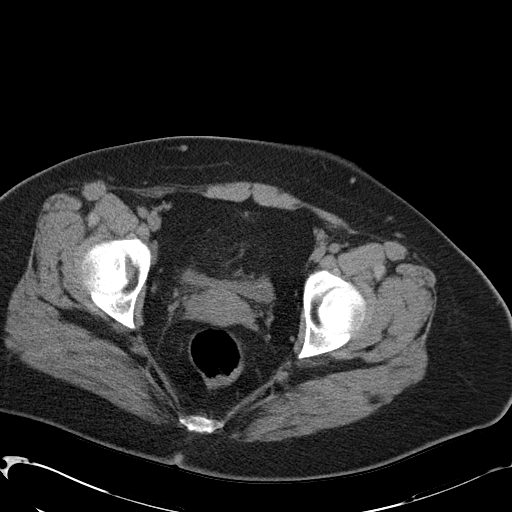
[im 31/105  soft-tissue]
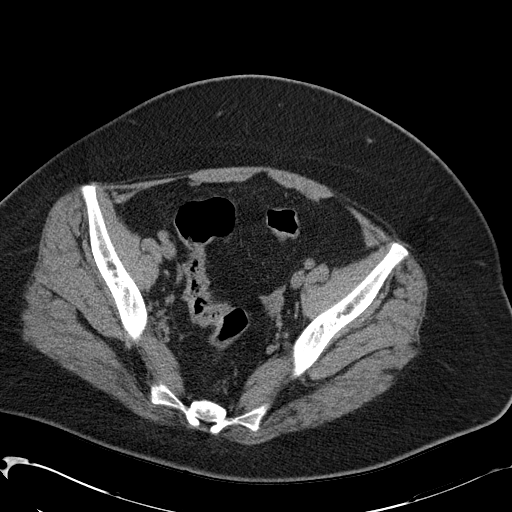
[im 35/105  soft-tissue]
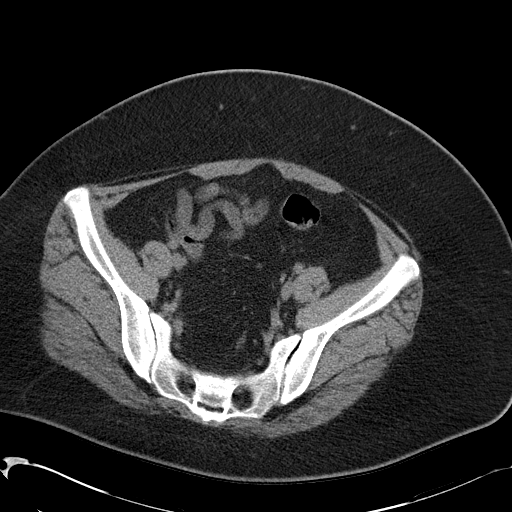
[im 44/105  soft-tissue]
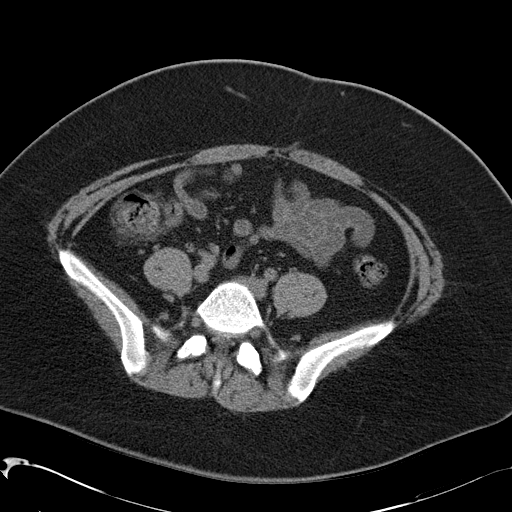
[im 53/105  soft-tissue]
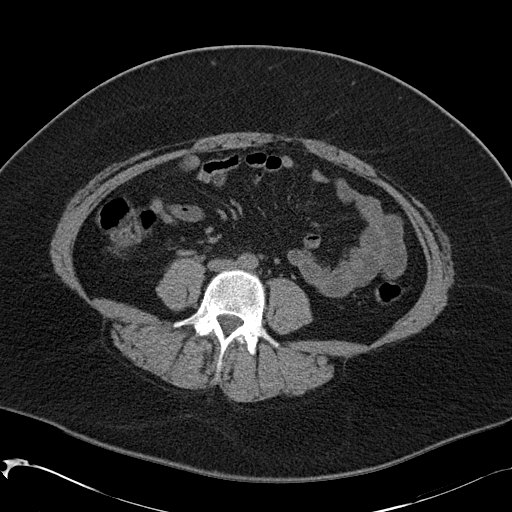
[im 61/105  soft-tissue]
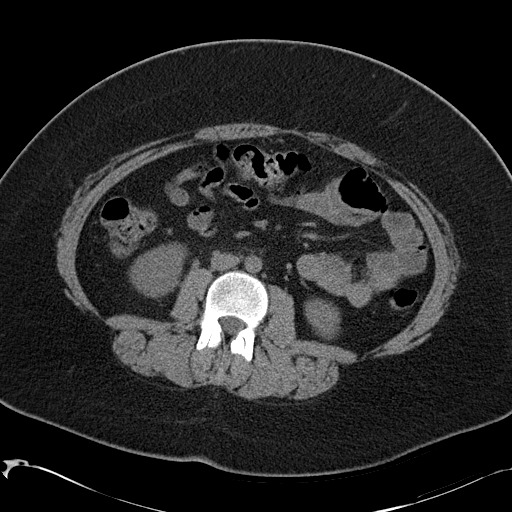
[im 70/105  soft-tissue]
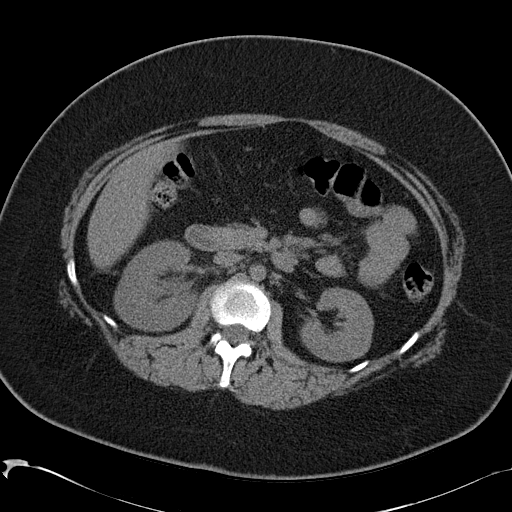
[im 70/105  bone]
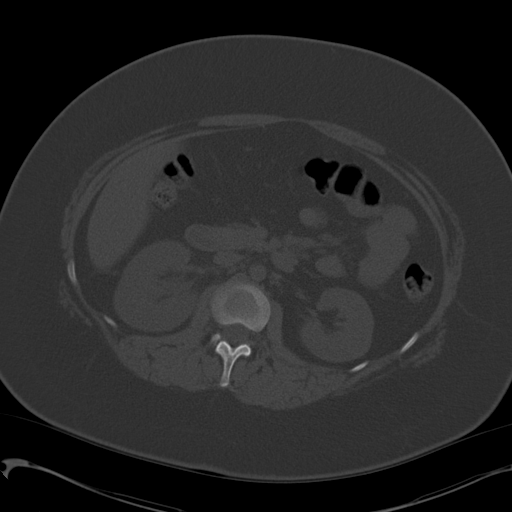
[im 74/105  soft-tissue]
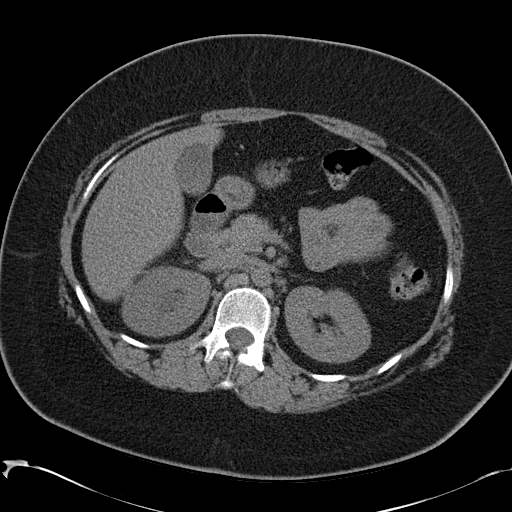
[im 83/105  soft-tissue]
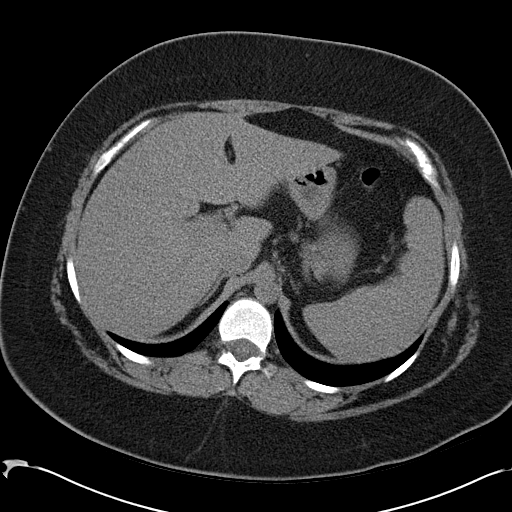
[im 92/105  soft-tissue]
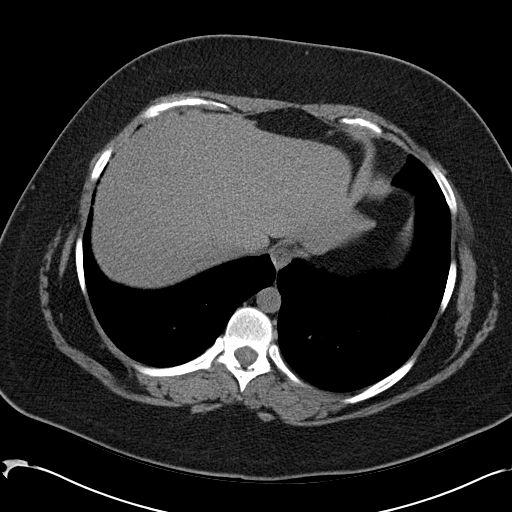
[im 100/105  soft-tissue]
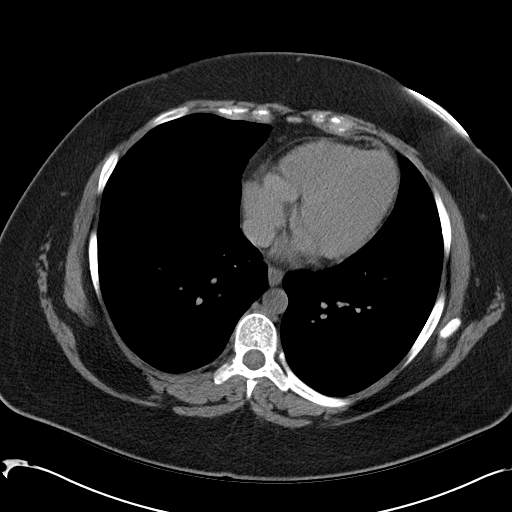

[Series 3: mpr coronal · coronal · 0.71mm/px · 3 of 81 slices shown]
[im 27/81  soft-tissue]
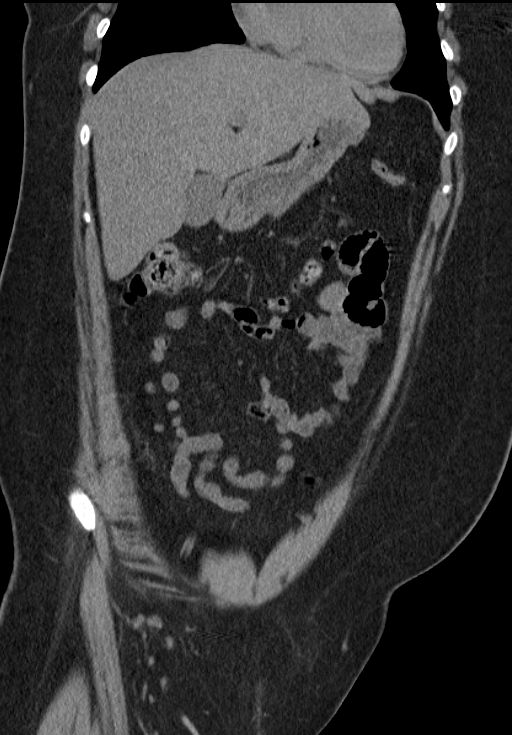
[im 36/81  soft-tissue]
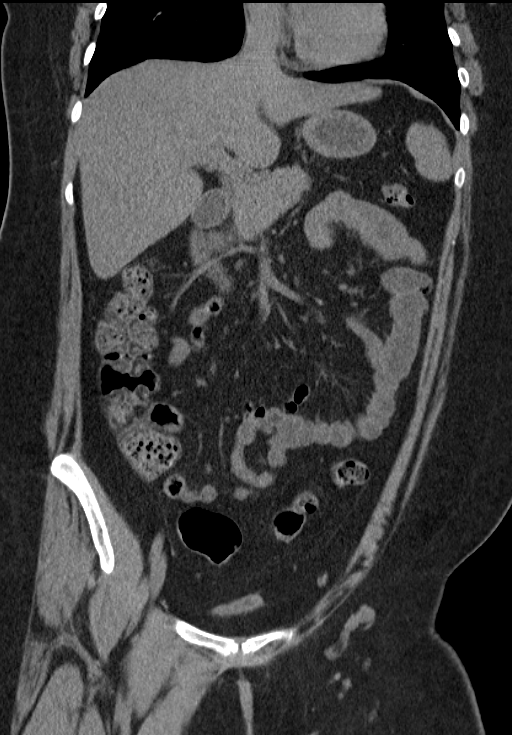
[im 45/81  soft-tissue]
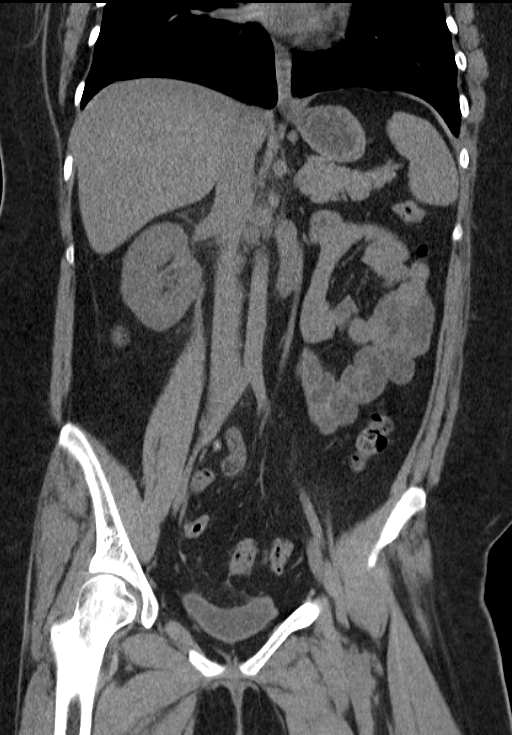

[16 of 46 positions shown; findings below may reference images not displayed]

FINDINGS: The liver, biliary tree, spleen, pancreas, adrenal glands, and left
kidney are normal. There is slight dilatation of the right
pelvicaliceal system and slight prominence of the right ureter to
the level where it crosses the right common iliac artery. The distal
right ureter is normal. Uterus and ovaries appear normal. There is a
small amount of fluid around the uterus and ovaries, normal for a
female of this age. Bladder appears normal.

The bowel is normal including the terminal ileum and appendix.
Osseous structures are normal.
IMPRESSION: Slight dilatation of the proximal right renal collecting system
without evidence of a stone or mass. The possibility that the
patient has recently passed a stone should be considered.

Otherwise, normal exam.

## 2016-07-02 ENCOUNTER — Ambulatory Visit: Payer: BLUE CROSS/BLUE SHIELD | Admitting: Women's Health

## 2016-07-08 ENCOUNTER — Ambulatory Visit (INDEPENDENT_AMBULATORY_CARE_PROVIDER_SITE_OTHER): Payer: 59 | Admitting: Adult Health

## 2016-07-08 ENCOUNTER — Encounter: Payer: Self-pay | Admitting: Adult Health

## 2016-07-08 VITALS — BP 121/62 | HR 72 | Ht 63.0 in | Wt 186.0 lb

## 2016-07-08 DIAGNOSIS — Z308 Encounter for other contraceptive management: Secondary | ICD-10-CM | POA: Diagnosis not present

## 2016-07-08 DIAGNOSIS — Z7689 Persons encountering health services in other specified circumstances: Secondary | ICD-10-CM

## 2016-07-08 MED ORDER — NORGESTIM-ETH ESTRAD TRIPHASIC 0.18/0.215/0.25 MG-25 MCG PO TABS: ORAL_TABLET | ORAL | 12 refills | Status: DC

## 2016-07-08 NOTE — Progress Notes (Signed)
Subjective:     Patient ID: Perlie MayoSarah Cuneo, female   DOB: [birth year], 20 y.o.   MRN: [id number]  HPI Maralyn SagoSarah is a 20 year old white female in to get OCs refilled.   Review of Systems Periods good, has never had sex Reviewed past medical,surgical, social and family history. Reviewed medications and allergies.     Objective:   Physical Exam BP 121/62 (BP Location: Left Arm, Patient Position: Sitting, Cuff Size: Normal)   Pulse 72   Ht 5\' 3"  (1.6 m)   Wt 186 lb (84.4 kg)   LMP 06/09/2016 (Approximate)   BMI 32.95 kg/m PHQ 2 score 0. Skin warm and dry. Lungs: clear to ausculation bilaterally. Cardiovascular: regular rate and rhythm.    Assessment:     1. Encounter for menstrual regulation       Plan:     Refilled tri lo sprintec disp 1 pack, take 1 daily with 12 refills Return in 1 year for pap and physical

## 2016-07-08 NOTE — Patient Instructions (Signed)
Pap and physical with me in 1 year Continue OCs

## 2016-07-21 ENCOUNTER — Other Ambulatory Visit: Payer: Self-pay | Admitting: Adult Health

## 2016-08-13 ENCOUNTER — Telehealth: Payer: Self-pay | Admitting: *Deleted

## 2016-08-13 MED ORDER — FLUCONAZOLE 150 MG PO TABS: ORAL_TABLET | ORAL | 1 refills | Status: AC

## 2016-08-13 MED ORDER — NYSTATIN-TRIAMCINOLONE 100000-0.1 UNIT/GM-% EX OINT: 1.0000 "application " | TOPICAL_OINTMENT | Freq: Two times a day (BID) | CUTANEOUS | 0 refills | Status: AC

## 2016-08-13 NOTE — Telephone Encounter (Signed)
Will rx diflucan and mytrex oint

## 2017-06-09 ENCOUNTER — Other Ambulatory Visit: Payer: Self-pay | Admitting: Adult Health

## 2017-08-05 ENCOUNTER — Other Ambulatory Visit: Payer: Self-pay | Admitting: Adult Health

## 2017-08-06 ENCOUNTER — Encounter: Payer: Self-pay | Admitting: *Deleted

## 2018-09-12 ENCOUNTER — Emergency Department (HOSPITAL_COMMUNITY)
Admission: EM | Admit: 2018-09-12 | Discharge: 2018-09-12 | Disposition: A | Discharge status: Home / Self Care | Payer: 59 | Attending: Emergency Medicine | Admitting: Emergency Medicine

## 2018-09-12 ENCOUNTER — Other Ambulatory Visit: Payer: Self-pay

## 2018-09-12 ENCOUNTER — Encounter (HOSPITAL_COMMUNITY): Payer: Self-pay | Admitting: Emergency Medicine

## 2018-09-12 DIAGNOSIS — R52 Pain, unspecified: Secondary | ICD-10-CM | POA: Diagnosis present

## 2018-09-12 DIAGNOSIS — B349 Viral infection, unspecified: Secondary | ICD-10-CM | POA: Diagnosis not present

## 2018-09-12 DIAGNOSIS — Z79899 Other long term (current) drug therapy: Secondary | ICD-10-CM | POA: Insufficient documentation

## 2018-09-12 MED ORDER — IBUPROFEN 800 MG PO TABS: 800.0000 mg | ORAL_TABLET | Freq: Three times a day (TID) | ORAL | 0 refills | Status: AC

## 2018-09-12 MED ORDER — PROMETHAZINE-DM 6.25-15 MG/5ML PO SYRP: 5.0000 mL | ORAL_SOLUTION | Freq: Four times a day (QID) | ORAL | 0 refills | Status: AC | PRN

## 2018-09-12 MED ORDER — OSELTAMIVIR PHOSPHATE 75 MG PO CAPS: 75.0000 mg | ORAL_CAPSULE | Freq: Two times a day (BID) | ORAL | 0 refills | Status: AC

## 2018-09-12 NOTE — ED Provider Notes (Signed)
[facility] EMERGENCY DEPARTMENT Provider Note   CSN: [id number] Arrival date & time: 09/12/18  1012    History   Chief Complaint Chief Complaint  Patient presents with  . Generalized Body Aches    HPI Shawna Wright is a 23 y.o. female.     HPI   Shawna Wright is a 23 y.o. female who presents to the Emergency Department complaining of sore throat, generalized body aches, and cough with fever.  Symptoms have been present since yesterday.  She also describes a frontal headache that began yesterday after several episodes of coughing.  She describes her fever as low-grade with max temp of 100.8 yesterday.  Cough is described as nonproductive and at times forceful.  She Mucinex and Tylenol last evening.  She woke this morning with worsening generalized body aches.  She denies abdominal or chest pain, shortness of breath, dysuria, vomiting or diarrhea.  Endorses possible exposure to influenza at her job.    Past Medical History:  Diagnosis Date  . Hidradenitis 06/06/2013  . Menstrual extraction 03/06/2013  . Obesity   . PCOS (polycystic ovarian syndrome)   . Vaginal itching 06/05/2015    Patient Active Problem List   Diagnosis Date Noted  . Encounter for menstrual regulation 07/08/2016  . Vaginal itching 06/05/2015  . Yeast infection 06/05/2015  . Boil 06/05/2015  . Obesity 03/20/2014  . Hidradenitis 06/06/2013  . Menstrual extraction 03/06/2013    History reviewed. No pertinent surgical history.   OB History    Gravida  0   Para  0   Term  0   Preterm  0   AB  0   Living  0     SAB  0   TAB  0   Ectopic  0   Multiple  0   Live Births               Home Medications    Prior to Admission medications   Medication Sig Start Date End Date Taking? Authorizing Provider  famotidine (PEPCID AC) 10 MG chewable tablet Chew 10 mg by mouth daily as needed for heartburn.    [provider]  fluconazole (DIFLUCAN) 150 MG tablet TAKE 1 TABLET NOW &  REPEAT IN 3 DAYS IF NEEDED. 08/13/16   Adline Potter, NP  ibuprofen (ADVIL,MOTRIN) 800 MG tablet Take 1 tablet (800 mg total) by mouth 3 (three) times daily. 09/12/18   Shynia Daleo, PA-C  Norgestimate-Ethinyl Estradiol Triphasic (TRI-LO-SPRINTEC) 0.18/0.215/0.25 MG-25 MCG tab TAKE 1 TABLET ONCE DAILY. 08/09/17   Adline Potter, NP  nystatin-triamcinolone ointment (MYCOLOG) Apply 1 application topically 2 (two) times daily. 08/13/16   Adline Potter, NP  oseltamivir (TAMIFLU) 75 MG capsule Take 1 capsule (75 mg total) by mouth 2 (two) times daily. 09/12/18   Kerissa Coia, PA-C  promethazine-dextromethorphan (PROMETHAZINE-DM) 6.25-15 MG/5ML syrup Take 5 mLs by mouth 4 (four) times daily as needed. 09/12/18   Pauline Aus, PA-C    Family History Family History  Problem Relation Age of Onset  . Heart disease Maternal Grandfather   . Cancer Paternal Grandmother        colon    Social History Social History   Tobacco Use  . Smoking status: Never Smoker  . Smokeless tobacco: Never Used  Substance Use Topics  . Alcohol use: No  . Drug use: No     Allergies   Penicillins   Review of Systems Review of Systems  Constitutional: Positive for chills and fever.  Negative for activity change and appetite change.  HENT: Positive for congestion and sore throat. Negative for facial swelling, rhinorrhea and trouble swallowing.   Eyes: Negative for visual disturbance.  Respiratory: Positive for cough. Negative for chest tightness, shortness of breath, wheezing and stridor.   Cardiovascular: Negative for chest pain.  Gastrointestinal: Negative for abdominal pain, diarrhea, nausea and vomiting.  Genitourinary: Negative for decreased urine volume and dysuria.  Musculoskeletal: Positive for myalgias (Generalized body aches). Negative for neck pain and neck stiffness.  Skin: Negative for rash.  Neurological: Positive for headaches. Negative for dizziness, weakness and numbness.    Hematological: Negative for adenopathy.  Psychiatric/Behavioral: Negative for confusion.     Physical Exam Updated Vital Signs BP 113/63 (BP Location: Right Arm)   Pulse 88   Temp 98.6 F (37 C) (Oral)   Resp 16   Ht 5\' 2"  (1.575 m)   Wt 93.2 kg   LMP 08/27/2018   SpO2 97%   BMI 37.56 kg/m   Physical Exam Vitals signs and nursing note reviewed.  Constitutional:      General: She is not in acute distress.    Appearance: She is well-developed. She is not ill-appearing.  HENT:     Head: Normocephalic.     Right Ear: Tympanic membrane and ear canal normal.     Left Ear: Tympanic membrane and ear canal normal.     Nose: No rhinorrhea.     Mouth/Throat:     Mouth: Mucous membranes are moist.     Pharynx: Oropharynx is clear. No oropharyngeal exudate or posterior oropharyngeal erythema.     Comments: Mild erythema of the oropharynx without edema or exudate.  Uvula is midline and nonedematous. Neck:     Musculoskeletal: Normal range of motion and neck supple. No neck rigidity.     Meningeal: Kernig's sign absent.  Cardiovascular:     Rate and Rhythm: Normal rate and regular rhythm.     Pulses: Normal pulses.  Pulmonary:     Effort: Pulmonary effort is normal. No respiratory distress.     Breath sounds: Normal breath sounds. No wheezing or rhonchi.  Chest:     Chest wall: No tenderness.  Abdominal:     General: There is no distension.     Palpations: Abdomen is soft.     Tenderness: There is no abdominal tenderness.  Musculoskeletal: Normal range of motion.        General: No tenderness.  Lymphadenopathy:     Cervical: No cervical adenopathy.  Skin:    General: Skin is warm.     Findings: No rash.  Neurological:     General: No focal deficit present.     Mental Status: She is alert. Mental status is at baseline.     Motor: No abnormal muscle tone.     Coordination: Coordination normal.      ED Treatments / Results  Labs (all labs ordered are listed, but only  abnormal results are displayed) Labs Reviewed - No data to display  EKG None  Radiology No results found.  Procedures Procedures (including critical care time)  Medications Ordered in ED Medications - No data to display   Initial Impression / Assessment and Plan / ED Course  I have reviewed the triage vital signs and the nursing notes.  Pertinent labs & imaging results that were available during my care of the patient were reviewed by me and considered in my medical decision making (see chart for details).  Patient well-appearing.  Nontoxic.  Vital signs are reassuring she is afebrile on recheck.  Patient does endorse exposure to influenza and she is requesting Tamiflu.  She appears appropriate for discharge home, I feel this is likely viral.  She agrees to treatment plan and close outpatient follow-up if needed.  Return precautions were discussed.  Final Clinical Impressions(s) / ED Diagnoses   Final diagnoses:  Viral illness    ED Discharge Orders         Ordered    oseltamivir (TAMIFLU) 75 MG capsule  2 times daily     09/12/18 1322    promethazine-dextromethorphan (PROMETHAZINE-DM) 6.25-15 MG/5ML syrup  4 times daily PRN     09/12/18 1322    ibuprofen (ADVIL,MOTRIN) 800 MG tablet  3 times daily     09/12/18 [address], PA-C 09/12/18 1343    Blane Ohara, MD 09/15/18 1334

## 2018-09-12 NOTE — Discharge Instructions (Addendum)
It is important to rest and drink plenty of fluids.  Tylenol every 4 hours for fever, you may alternate this with the prescription ibuprofen.  If you decide to take the Tamiflu, be sure to take it within 48 hours of onset of your symptoms.  Follow-up with your primary doctor for recheck or return to the ER for any worsening symptoms.

## 2018-09-12 NOTE — ED Triage Notes (Signed)
PT c/o generalized body aches, sore throat, non-productive cough and low grade fever that started yesterday morning. PT last had tylenol at 2230 last night with mucinex.

## 2020-12-27 ENCOUNTER — Ambulatory Visit: Payer: 59 | Admitting: Allergy and Immunology

## 2023-04-15 ENCOUNTER — Ambulatory Visit: Payer: BC Managed Care – PPO | Admitting: Nurse Practitioner
